# Patient Record
Sex: Female | Born: 1980 | Race: White | Hispanic: No | Marital: Married
Health system: Southern US, Community
[De-identification: ages and names within clinical notes are randomized; demographics above are authoritative.]

---

## 2013-12-06 ENCOUNTER — Emergency Department: Payer: Self-pay | Admitting: Student

## 2013-12-06 LAB — COMPREHENSIVE METABOLIC PANEL
Albumin: 4.6 g/dL (ref 3.4–5.0)
Alkaline Phosphatase: 34 U/L — ABNORMAL LOW
Anion Gap: 11 (ref 7–16)
BILIRUBIN TOTAL: 0.7 mg/dL (ref 0.2–1.0)
BUN: 14 mg/dL (ref 7–18)
CALCIUM: 9.6 mg/dL (ref 8.5–10.1)
Chloride: 105 mmol/L (ref 98–107)
Co2: 25 mmol/L (ref 21–32)
Creatinine: 0.71 mg/dL (ref 0.60–1.30)
EGFR (African American): >60
GLUCOSE: 133 mg/dL — AB (ref 65–99)
Osmolality: 284 (ref 275–301)
Potassium: 3.7 mmol/L (ref 3.5–5.1)
SGOT(AST): 14 U/L — ABNORMAL LOW (ref 15–37)
SGPT (ALT): 23 U/L
Sodium: 141 mmol/L (ref 136–145)
TOTAL PROTEIN: 8.8 g/dL — AB (ref 6.4–8.2)

## 2013-12-06 LAB — CBC WITH DIFFERENTIAL/PLATELET
BASOS PCT: 0.5 %
Basophil #: 0.1 10*3/uL (ref 0.0–0.1)
EOS ABS: 0 10*3/uL (ref 0.0–0.7)
EOS PCT: 0 %
HCT: 40.8 % (ref 35.0–47.0)
HGB: 13.4 g/dL (ref 12.0–16.0)
LYMPHS ABS: 1.2 10*3/uL (ref 1.0–3.6)
Lymphocyte %: 8.6 %
MCH: 30.7 pg (ref 26.0–34.0)
MCHC: 32.9 g/dL (ref 32.0–36.0)
MCV: 93 fL (ref 80–100)
MONO ABS: 0.5 x10 3/mm (ref 0.2–0.9)
Monocyte %: 3.4 %
NEUTROS PCT: 87.5 %
Neutrophil #: 12.2 10*3/uL — ABNORMAL HIGH (ref 1.4–6.5)
PLATELETS: 289 10*3/uL (ref 150–440)
RBC: 4.37 10*6/uL (ref 3.80–5.20)
RDW: 12.7 % (ref 11.5–14.5)
WBC: 14 10*3/uL — ABNORMAL HIGH (ref 3.6–11.0)

## 2013-12-06 LAB — URINALYSIS, COMPLETE
BACTERIA: NONE SEEN
Bilirubin,UR: NEGATIVE
Blood: NEGATIVE
Glucose,UR: NEGATIVE mg/dL (ref 0–75)
Leukocyte Esterase: NEGATIVE
NITRITE: NEGATIVE
Ph: 6 (ref 4.5–8.0)
RBC,UR: 1 /HPF (ref 0–5)
SPECIFIC GRAVITY: 1.027 (ref 1.003–1.030)
WBC UR: 1 /HPF (ref 0–5)

## 2013-12-06 LAB — LIPASE, BLOOD: LIPASE: 80 U/L (ref 73–393)

## 2013-12-07 LAB — URINALYSIS, COMPLETE
BLOOD: NEGATIVE
Bilirubin,UR: NEGATIVE
Glucose,UR: NEGATIVE mg/dL (ref 0–75)
NITRITE: NEGATIVE
Ph: 6 (ref 4.5–8.0)
Protein: 30
RBC,UR: 1 /HPF (ref 0–5)
Specific Gravity: 1.027 (ref 1.003–1.030)
WBC UR: 5 /HPF (ref 0–5)

## 2013-12-07 LAB — COMPREHENSIVE METABOLIC PANEL
ALBUMIN: 4.3 g/dL (ref 3.4–5.0)
Alkaline Phosphatase: 33 U/L — ABNORMAL LOW
Anion Gap: 8 (ref 7–16)
BUN: 12 mg/dL (ref 7–18)
Bilirubin,Total: 0.8 mg/dL (ref 0.2–1.0)
CHLORIDE: 109 mmol/L — AB (ref 98–107)
CO2: 23 mmol/L (ref 21–32)
CREATININE: 0.59 mg/dL — AB (ref 0.60–1.30)
Calcium, Total: 9.1 mg/dL (ref 8.5–10.1)
GLUCOSE: 105 mg/dL — AB (ref 65–99)
Osmolality: 280 (ref 275–301)
Potassium: 3.5 mmol/L (ref 3.5–5.1)
SGOT(AST): 11 U/L — ABNORMAL LOW (ref 15–37)
SGPT (ALT): 20 U/L
Sodium: 140 mmol/L (ref 136–145)
Total Protein: 7.7 g/dL (ref 6.4–8.2)

## 2013-12-07 LAB — CBC
HCT: 38.4 % (ref 35.0–47.0)
HGB: 12.4 g/dL (ref 12.0–16.0)
MCH: 30.7 pg (ref 26.0–34.0)
MCHC: 32.3 g/dL (ref 32.0–36.0)
MCV: 95 fL (ref 80–100)
Platelet: 264 10*3/uL (ref 150–440)
RBC: 4.05 10*6/uL (ref 3.80–5.20)
RDW: 12.6 % (ref 11.5–14.5)
WBC: 12.1 10*3/uL — ABNORMAL HIGH (ref 3.6–11.0)

## 2013-12-08 LAB — CBC WITH DIFFERENTIAL/PLATELET
BASOS ABS: 0 10*3/uL (ref 0.0–0.1)
Basophil %: 0.1 %
Eosinophil #: 0 10*3/uL (ref 0.0–0.7)
Eosinophil %: 0 %
HCT: 29.7 % — ABNORMAL LOW (ref 35.0–47.0)
HGB: 9.8 g/dL — AB (ref 12.0–16.0)
LYMPHS ABS: 2 10*3/uL (ref 1.0–3.6)
Lymphocyte %: 27.5 %
MCH: 31.4 pg (ref 26.0–34.0)
MCHC: 33.1 g/dL (ref 32.0–36.0)
MCV: 95 fL (ref 80–100)
Monocyte #: 0.8 x10 3/mm (ref 0.2–0.9)
Monocyte %: 11.6 %
Neutrophil #: 4.3 10*3/uL (ref 1.4–6.5)
Neutrophil %: 60.8 %
Platelet: 181 10*3/uL (ref 150–440)
RBC: 3.14 10*6/uL — ABNORMAL LOW (ref 3.80–5.20)
RDW: 12.7 % (ref 11.5–14.5)
WBC: 7.2 10*3/uL (ref 3.6–11.0)

## 2013-12-08 LAB — COMPREHENSIVE METABOLIC PANEL
ALBUMIN: 3.1 g/dL — AB (ref 3.4–5.0)
Alkaline Phosphatase: 21 U/L — ABNORMAL LOW
Anion Gap: 7 (ref 7–16)
BILIRUBIN TOTAL: 0.4 mg/dL (ref 0.2–1.0)
BUN: 9 mg/dL (ref 7–18)
CO2: 22 mmol/L (ref 21–32)
Calcium, Total: 7.6 mg/dL — ABNORMAL LOW (ref 8.5–10.1)
Chloride: 116 mmol/L — ABNORMAL HIGH (ref 98–107)
Creatinine: 0.46 mg/dL — ABNORMAL LOW (ref 0.60–1.30)
Glucose: 150 mg/dL — ABNORMAL HIGH (ref 65–99)
Osmolality: 290 (ref 275–301)
Potassium: 3.4 mmol/L — ABNORMAL LOW (ref 3.5–5.1)
SGOT(AST): 13 U/L — ABNORMAL LOW (ref 15–37)
SGPT (ALT): 13 U/L — ABNORMAL LOW
Sodium: 145 mmol/L (ref 136–145)
TOTAL PROTEIN: 5.8 g/dL — AB (ref 6.4–8.2)

## 2013-12-09 ENCOUNTER — Inpatient Hospital Stay: Payer: Self-pay | Admitting: Internal Medicine

## 2013-12-09 LAB — BASIC METABOLIC PANEL
Anion Gap: 9 (ref 7–16)
Anion Gap: 6 — ABNORMAL LOW (ref 7–16)
BUN: 4 mg/dL — ABNORMAL LOW (ref 7–18)
BUN: 2 mg/dL — ABNORMAL LOW (ref 7–18)
CHLORIDE: 110 mmol/L — AB (ref 98–107)
CO2: 23 mmol/L (ref 21–32)
Calcium, Total: 7.4 mg/dL — ABNORMAL LOW (ref 8.5–10.1)
Calcium, Total: 7.3 mg/dL — ABNORMAL LOW (ref 8.5–10.1)
Chloride: 108 mmol/L — ABNORMAL HIGH (ref 98–107)
Co2: 25 mmol/L (ref 21–32)
Creatinine: 0.48 mg/dL — ABNORMAL LOW (ref 0.60–1.30)
Creatinine: 0.54 mg/dL — ABNORMAL LOW (ref 0.60–1.30)
EGFR (African American): >60
EGFR (African American): >60
EGFR (Non-African Amer.): >60
Glucose: 103 mg/dL — ABNORMAL HIGH (ref 65–99)
Glucose: 93 mg/dL (ref 65–99)
Osmolality: 280 (ref 275–301)
Osmolality: 273 (ref 275–301)
Potassium: 2.7 mmol/L — ABNORMAL LOW (ref 3.5–5.1)
Potassium: 3 mmol/L — ABNORMAL LOW (ref 3.5–5.1)
SODIUM: 142 mmol/L (ref 136–145)
Sodium: 139 mmol/L (ref 136–145)

## 2013-12-09 LAB — POTASSIUM: Potassium: 3 mmol/L — ABNORMAL LOW (ref 3.5–5.1)

## 2013-12-10 LAB — BASIC METABOLIC PANEL
Anion Gap: 6 — ABNORMAL LOW (ref 7–16)
BUN: 1 mg/dL — ABNORMAL LOW (ref 7–18)
CO2: 27 mmol/L (ref 21–32)
CREATININE: 0.64 mg/dL (ref 0.60–1.30)
Calcium, Total: 7.7 mg/dL — ABNORMAL LOW (ref 8.5–10.1)
Chloride: 109 mmol/L — ABNORMAL HIGH (ref 98–107)
EGFR (African American): >60
Glucose: 94 mg/dL (ref 65–99)
Osmolality: 279 (ref 275–301)
Potassium: 3.1 mmol/L — ABNORMAL LOW (ref 3.5–5.1)
SODIUM: 142 mmol/L (ref 136–145)

## 2014-06-08 NOTE — H&P (Signed)
PATIENT NAME:  Kathryn Zamora, [name] MR#:  [id number] DATE OF BIRTH:  13-Oct-1980  DATE OF ADMISSION:  12/08/2013  PRIMARY CARE DOCTOR: Nonlocal.   EMERGENCY ROOM PHYSICIAN: Darien Ramusavid W. Kaminski, MD   ADMITTING DOCTOR: Crissie FiguresEdavally N. Krystle Polcyn, MD   CHIEF COMPLAINT: Intractable nausea and vomiting, ongoing for the past 2 days.    HISTORY OF PRESENT ILLNESS: A 34 year old Caucasian female visitor from [place] visiting [place] mother[place]in[place]law in [place] came with the complaints of ongoing intractable nausea and vomiting for the past 2 days. The patient states that she was in her usual state of health until 2 days ago when she started having nausea associated with some vomiting and upper abdominal pain for which she came to the Emergency Room on the day prior and was evaluated by the ED physician and after workup was sent home on Phenergan suppositories. The patient went home on Phenergan suppositories but continued to have intractable nausea and vomiting with associated abdominal pain, hence came back to the Emergency Room tonight for repeat evaluation.   The patient was evaluated by the ED physician today again and workup was essentially negative except for the gallbladder, the abdominal ultrasonogram, which was done the day prior showed some gallstones which as per the patient has been having from prior. The patient denies any blood in the vomitus. She says that vomitus is primarily biliary. Denies any diarrhea. No fever. No cough. No chest pain. No dizziness or loss of consciousness. Denies any dysuria, hematuria or frequency. The patient gives a history of cyclical vomiting ongoing for the past 1 year and she usually has episodes about 4 to 5 per year and one time she was admitted for intractable nausea and vomiting in [place] for 1 week duration. And she states that these symptoms get exacerbated whenever she has increased stress and currently she admits that she is under increased stress.    PAST MEDICAL  HISTORY:  1.  History of cyclical vomiting.  2.  History of depression, on Celexa.   PAST SURGICAL HISTORY: Status post appendectomy.   MEDICATIONS: Celexa 20 mg p.o. daily.   ALLERGIES: No known drug allergies.   SOCIAL HISTORY: She is married, has 2 children. She is a resident of [place] and visiting her mother-in-law in [place] . Denies any history of smoking, alcohol or substance abuse. He last menstrual period 11/22/2013.   FAMILY HISTORY: Noncontributory for any cardiac conditions or cancers or diabetes.   REVIEW OF SYSTEMS:    CONSTITUTIONAL: Negative for fever, fatigue, generalized weakness. No excessive weight gain or weight loss recently.  EYES: Negative for blurred vision or double vision. No pain. No redness. No inflammation.  EARS, NOSE AND THROAT: Negative for tinnitus, ear pain, hearing loss, epistaxis, nasal discharge or difficulty swallowing.  RESPIRATORY: Negative for cough, wheezing, hemoptysis, dyspnea, painful respirations.  CARDIOVASCULAR: Negative for chest pain, dyspnea on exertion, orthopnea or paroxysmal nocturnal dyspnea or pedal edema, syncope, palpitations.  GASTROINTESTINAL: As mentioned in the history of present illness. The patient has been having intractable nausea and vomiting ongoing for the past 2 days. No blood in the vomitus. No diarrhea and having normal bowel movements. The last bowel movement was the day prior, and she does have some abdominal pain associated with some nausea and vomiting.  GENITOURINARY: Negative for dysuria, hematuria, frequency.  ENDOCRINE: Negative for polyuria or polydipsia. No heat or cold intolerance.  HEMATOLOGY AND LYMPHATIC: Negative for anemia, easy bruising or bleeding.  INTEGUMENTARY: Negative for skin rash, lesions, acne.  MUSCULOSKELETAL: Negative for any arthritis, joint swelling or pain.  NEUROLOGICAL: Negative for focal weakness or numbness. No history of CVA, TIA,  seizure disorder.  PSYCHIATRIC: She does  have a history of chronic depression for which she takes Celexa and states it is under control, but she does have periods of increased stress on and off which exacerbates her GI symptoms.   PHYSICAL EXAMINATION:  VITAL SIGNS: Temperature 98.1 degrees Fahrenheit, pulse rate 79 per minute, regular, blood pressure 135/98, respirations 18 per minute, O2 saturations 99% on room.  GENERAL: Young female, alert, awake, and oriented times x 3, was in mild to moderate distress because of the abdominal pain and the nausea. Pleasant and cooperative.  HEAD: Atraumatic, normocephalic.  EYES: Pupils equal and reacting to light and accommodation. No conjunctival pallor. No scleral icterus. Extraocular movements intact.  NOSE: No nasal lesions. No drainage.  EARS: No drainage. No external lesions.  MOUTH: No mucosal lesions. No exudates. No hemorrhages. No masses.  NECK: Supple. No JVD. No thyromegaly. No carotid bruit. No lymphadenopathy. Full range of motions.   RESPIRATORY: Good respiratory effort. Not using any accessory muscles of respiration. Bilateral vesicular breath sounds present. No rales or rhonchi bilaterally.  CARDIOVASCULAR: S1, S2 regular. No murmurs appreciated. No gallops. No clicks.  GASTROINTESTINAL: Diffuse upper abdomen mild tenderness present. No guarding. No rigidity. Bowel sounds present and equal in all 4 quadrants. No hepatosplenomegaly.  GENITOURINARY: Deferred.  MUSCULOSKELETAL: Range of motion normal in all areas and normal strength.  SKIN: Inspection within normal limits.  LYMPHATIC: Negative for cervical lymphadenopathy.  VASCULAR: Good dorsalis pedis and posterior tibial pulses.  NEUROLOGICAL: Alert, awake and oriented x 3. Cranial nerves II through XII are grossly intact. Deep tendon reflexes 2+ bilateral and symmetrical. No sensory or focal deficit.  PSYCHIATRIC: Judgment and insight adequate. Alert and oriented x 3. Looks a little bit anxious. Memory and mood within normal  limits.    LABORATORY DATA: Urine pregnancy test negative.   Serum glucose 105, BUN 12, creatinine 0.59, sodium 140, potassium 3.5, chloride 109, bicarb 23, total serum calcium 9.1, total protein 7.7, albumin 4.3, total bilirubin 0.8, alkaline phosphatase 33, AST 11, ALT 20 and WBC 12.1, hemoglobin 12.4, hematocrit 38.4, platelet count 264.   Urinalysis: Hazy, 2+ ketones, nitrite negative, LE trace, WBC 5 and bacteria, trace.   IMAGING STUDIES: Ultrasound of the abdomen done last night. Several gallstones demonstrated in the dependent portion of the gallbladder measuring up to 6 mm in diameter. No gallbladder wall thickening. Murphy sign is negative. Common bile duct no dilatation.  ASSESSMENT AND PLAN: A 34 year old Caucasian female with a history of cyclical vomiting, history of depression, a resident of [place] visiting her mother-in-law in [place] presents to the Emergency Room with the complaints of ongoing intractable nausea and vomiting with associated abdominal pain for the past 2 days.    1.  Intractable nausea and vomiting of 2 day duration. History of cyclical vomiting. Not controlled with oral or rectal Phenergan suppositories. Workup was essentially negative except for  mildly elevated WBC count and chronic gallstones by ultrasound.  Plan: Admit to med/surg floor. Clear liquid diet. IV antiemetics, Phenergan/Reglan or Zofran. Pain control medications. IV fluids, D5 normal saline. Follow up labs. Consider further workup including gastrointestinal evaluation if symptoms are not controlled.  2.  History of cyclical vomiting, which was diagnosed about a year ago. The patient gets episodes, about 4 to 5 episodes per year, which are exacerbated with stress. Currently, the patient  has increased stress.   3.  Gallstones by ultrasound. Per patient, she does have a history of gallstones, which were diagnosed about a year ago. No evidence of inflammation on the ultrasound. Monitor for now.  Consider further workup if her symptoms warrant.  4.  History of depression, stable on Celexa. Continue Celexa 20 mg daily.  5.  Mild leukocytosis, most likely secondary due to stress. No history of any fever and the patient does not look septic and no evidence of infection. Monitor, repeat CBC and to follow temperature curve.  6.  Miscellaneous. Deep vein thrombosis prophylaxis, subcutaneous Lovenox. Gastrointestinal prophylaxis, Protonix.    CODE STATUS: Full code.   TIME SPENT: 55 minutes.   ____________________________ Crissie Figures, MD enr:AT D: 12/08/2013 00:00:00 ET T: 12/08/2013 00:53:40 ET JOB#: [id number]  cc: Crissie Figures, MD, <Dictator> Crissie Figures MD ELECTRONICALLY SIGNED 12/08/2013 7:29

## 2014-06-08 NOTE — Discharge Summary (Signed)
PATIENT NAME:  Kathryn Zamora, Kathryn Zamora MR#:  [id number] DATE OF BIRTH:  May 14, 1980  DATE OF ADMISSION:  12/09/2013 DATE OF DISCHARGE:  12/11/2013  ADMITTING DIAGNOSIS: Intractable nausea, vomiting.   DISCHARGE DIAGNOSES:  1.  Intractable nausea and vomiting due to cyclic vomiting syndrome, now significantly improved.  2.  Depression.  3.  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  4.  Status post appendectomy.  5.  Incidental gallstones without evidence of any acute gallbladder disease.  6.  Mild leukocytosis, felt to be reactive.   CONSULTANTS: None.   PERTINENT LABORATORIES AND EVALUATIONS: Admitting glucose 133, BUN 14, creatinine 0.71, sodium 141, potassium 3.7, chloride 105, CO2 of 25, calcium 9.6. LFTs: Total protein 8.8, albumin 4.6, bilirubin total 0.7, alk phos 34, AST of 14, ALT is 23. WBC 14 hemoglobin 13.4, platelet count 289,000. Hemoglobin at discharge was 9.8. Ultrasound of the abdomen showed cholelithiasis without any other abnormality. Abdominal 3 way showed no acute cardiopulmonary processes.   HOSPITAL COURSE: Please refer to Hdone by the admitting physician. The patient is a 34 year old white female diagnosed with cyclical vomiting syndrome who actually lives in [place] who was traveling through, presented with nausea, vomiting and abdominal pain. The patient was admitted and was symptomatically treated with antiemetics and pain control. With replacement of her electrolytes, her diet was slowly advanced. She was slow to improve; however, on the day of discharge, she was doing much better and is tolerating diet and is stable for discharge. She needs further follow up with her primary care provider and a primary GI physician.   DISCHARGE MEDICATIONS: Phenergan 25 one suppository rectally q. 8 p.r.n. nausea, potassium 10 mEq 2 tablets b.i.d. x 4 days.   DIET: Regular.   ACTIVITY: As tolerated.   FOLLOWUP:  With primary MD in 1-2 weeks.   TIME SPENT ON THIS DISCHARGE: Thirty-five minutes.     ____________________________ Lafonda Mosses Posey Pronto, MD shp:TT D: 12/12/2013 08:48:56 ET T: 12/12/2013 16:16:31 ET JOB#: [id number]  cc: Marquisa Salih H. Posey Pronto, MD, <Dictator> Alric Seton MD ELECTRONICALLY SIGNED 12/22/2013 8:37

## 2015-07-10 IMAGING — CR DG ABDOMEN 3V
1 series · 3 of 3 positions shown · non-contrast
Comparison: None.

CLINICAL DATA: Vomiting for 2 days.

EXAM:
ABDOMEN SERIES

[Series 1: dxr abdomen 3-way (incl pa cxr) · 0.14mm/px · 3 of 3 slices shown]
[im 1/3]
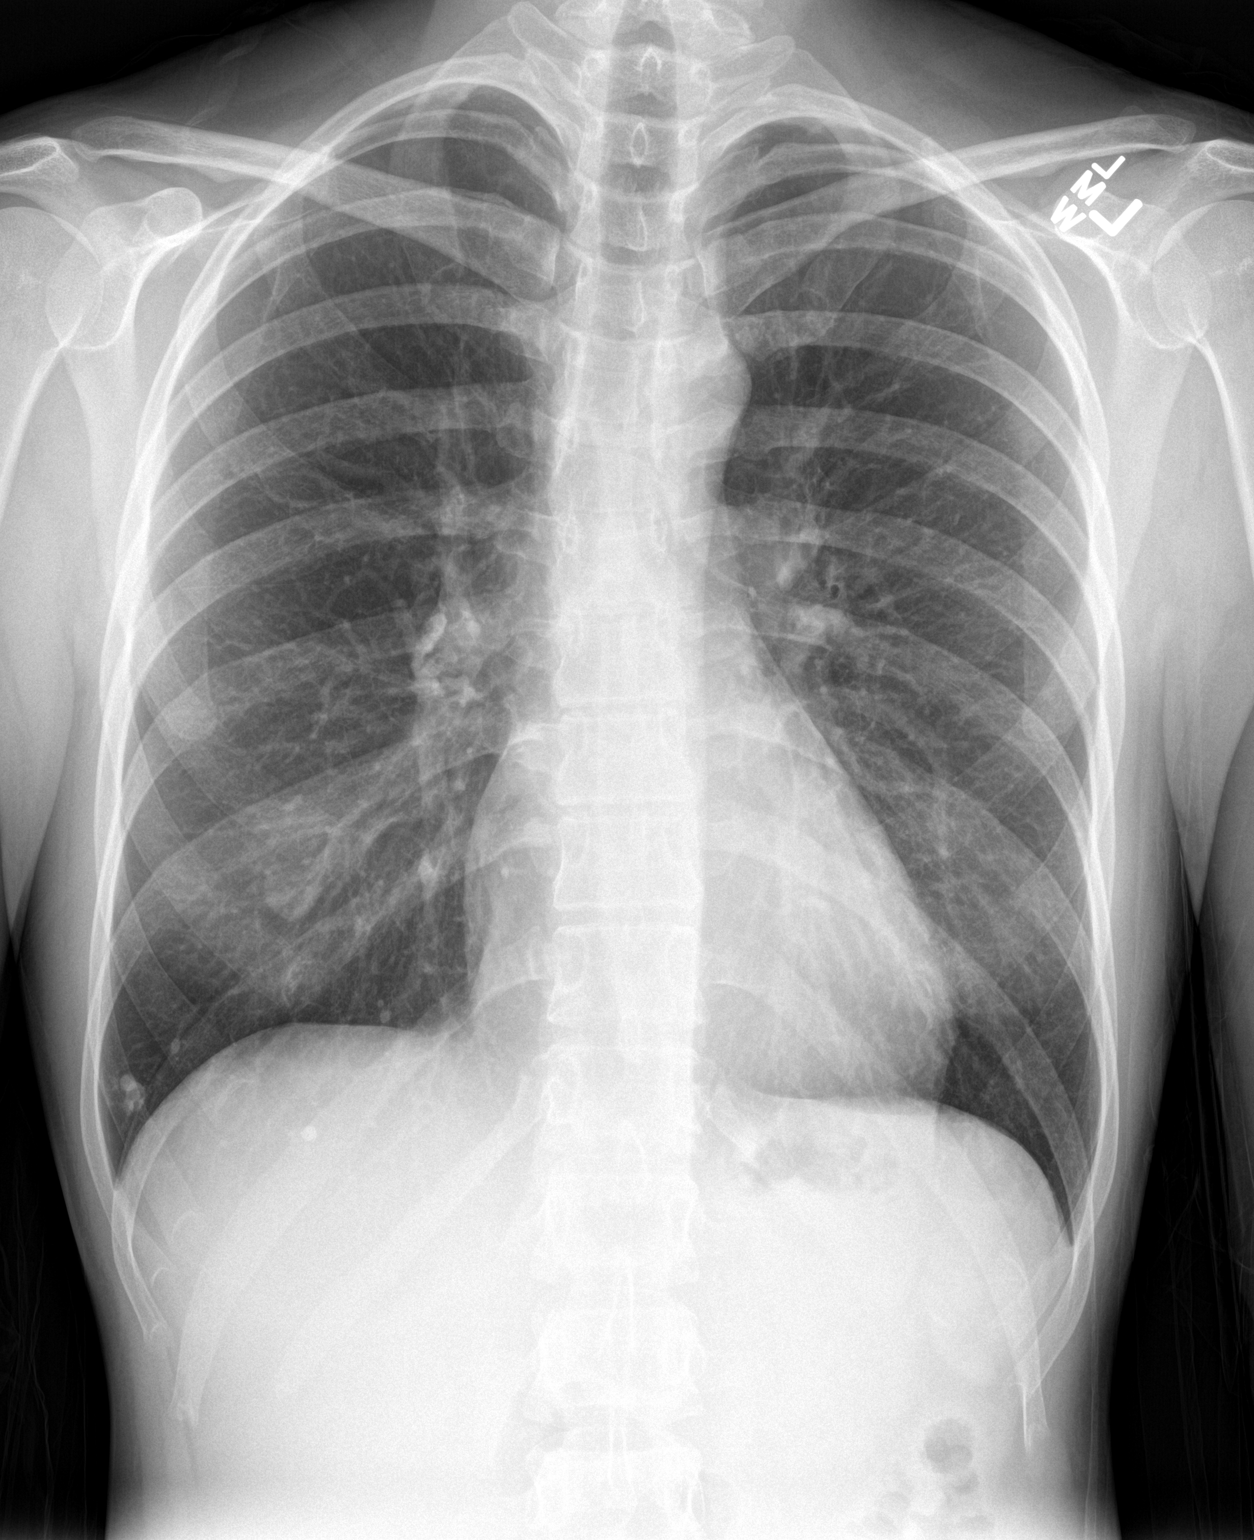
[im 2/3]
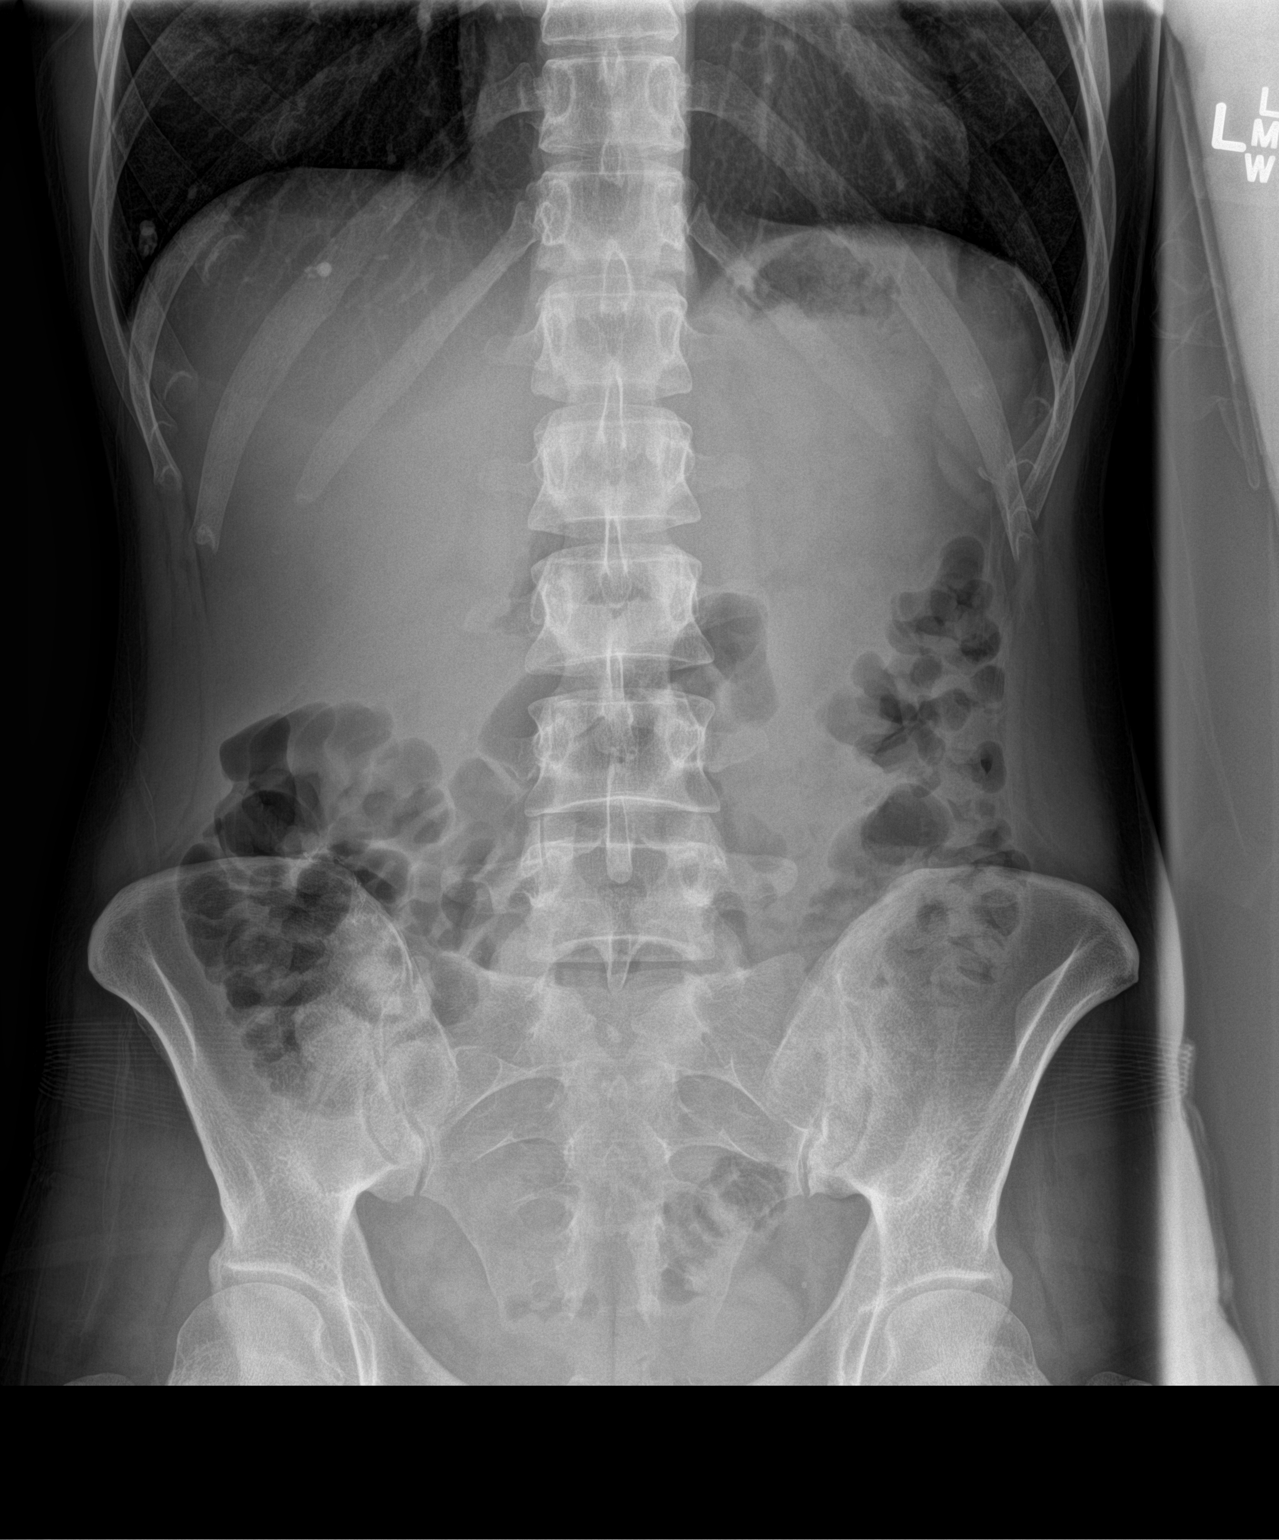
[im 3/3]
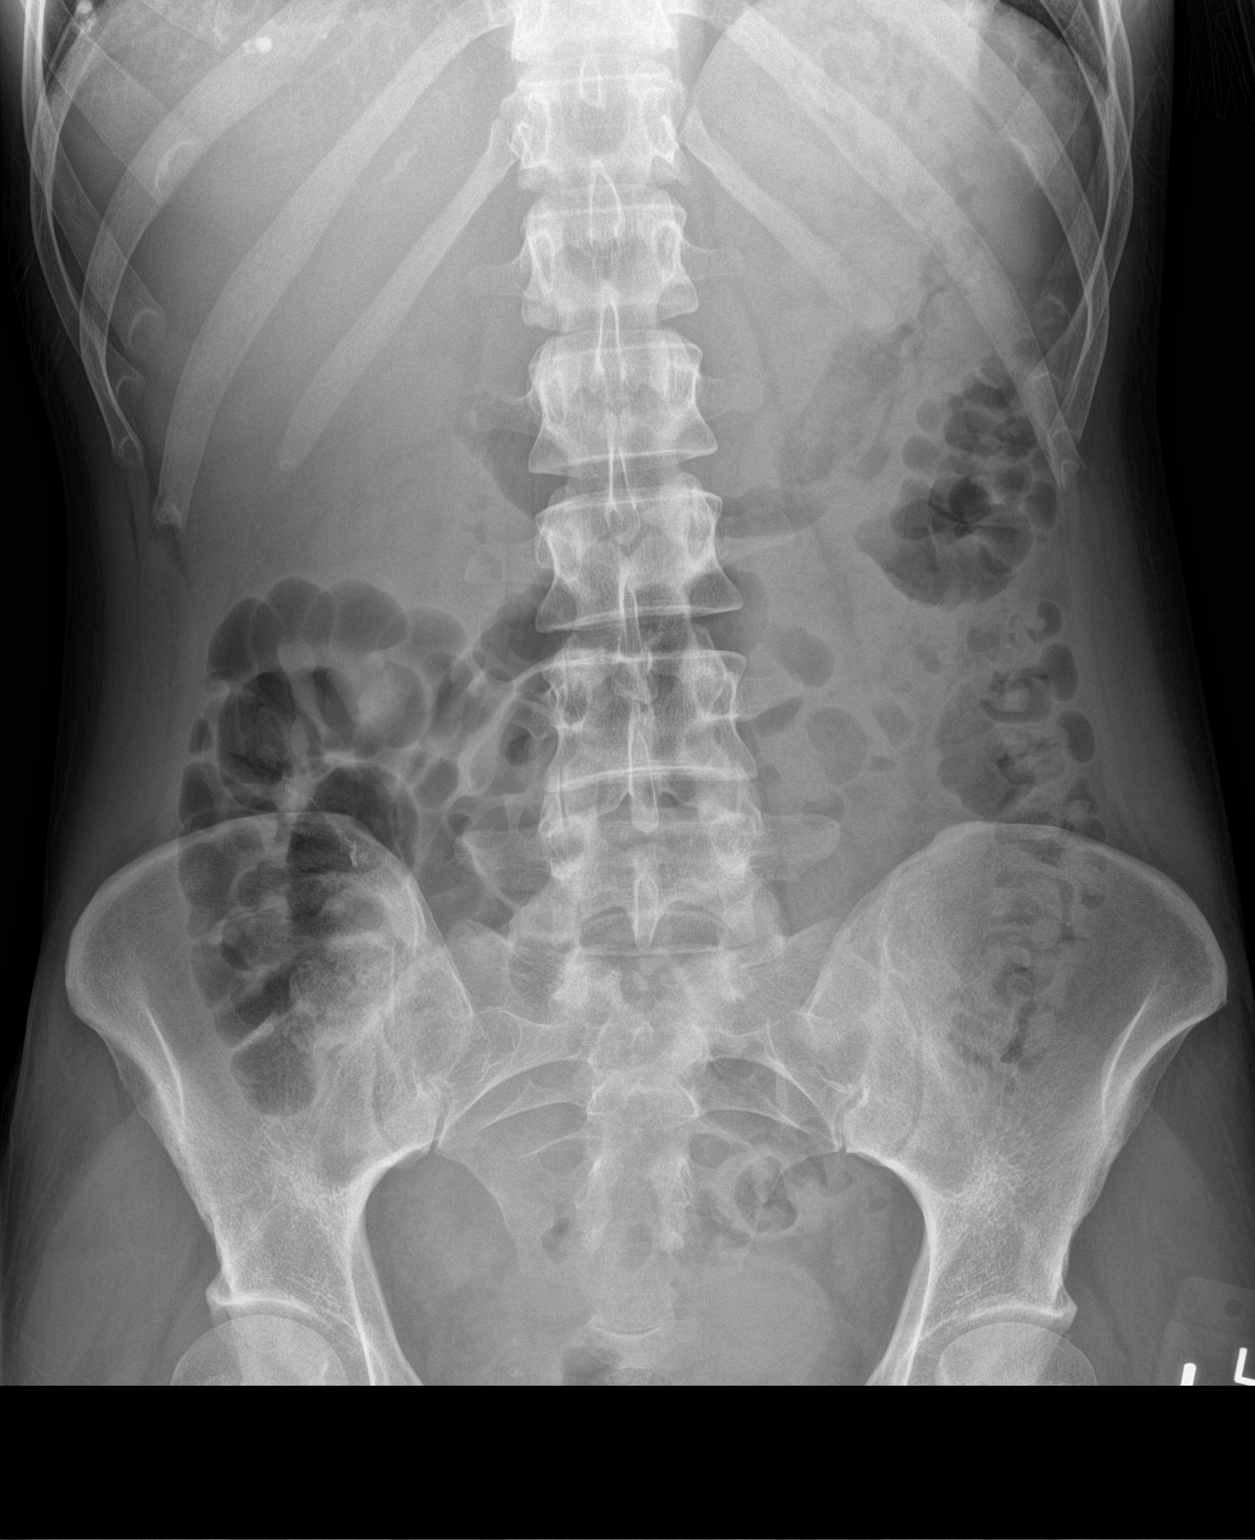

[3 of 3 positions shown; findings below may reference images not displayed]

FINDINGS: Calcified granulomas in the right lung base. Lungs otherwise clear.
Heart is normal size. No effusions.

There is normal bowel gas pattern. No free air. No organomegaly or
suspicious calcification. No acute bony abnormality.
IMPRESSION: No acute cardiopulmonary disease.

No evidence of bowel obstruction or free air.

## 2019-02-14 ENCOUNTER — Ambulatory Visit: Payer: Self-pay | Attending: Internal Medicine

## 2019-02-14 DIAGNOSIS — Z20828 Contact with and (suspected) exposure to other viral communicable diseases: Secondary | ICD-10-CM | POA: Insufficient documentation

## 2019-02-14 DIAGNOSIS — Z20822 Contact with and (suspected) exposure to covid-19: Secondary | ICD-10-CM

## 2019-02-15 LAB — NOVEL CORONAVIRUS, NAA: SARS-CoV-2, NAA: NOT DETECTED
